# Patient Record
Sex: Female | Born: 1995 | Race: Black or African American | Hispanic: No | Marital: Single
Health system: Southern US, Community
[De-identification: ages and names within clinical notes are randomized; demographics above are authoritative.]

---

## 2017-03-20 DIAGNOSIS — S0993XA Unspecified injury of face, initial encounter: Secondary | ICD-10-CM | POA: Diagnosis present

## 2017-03-20 DIAGNOSIS — S161XXA Strain of muscle, fascia and tendon at neck level, initial encounter: Secondary | ICD-10-CM | POA: Insufficient documentation

## 2017-03-20 DIAGNOSIS — Y999 Unspecified external cause status: Secondary | ICD-10-CM | POA: Insufficient documentation

## 2017-03-20 DIAGNOSIS — Y9389 Activity, other specified: Secondary | ICD-10-CM | POA: Insufficient documentation

## 2017-03-20 DIAGNOSIS — Y92410 Unspecified street and highway as the place of occurrence of the external cause: Secondary | ICD-10-CM | POA: Insufficient documentation

## 2017-03-20 DIAGNOSIS — S01112A Laceration without foreign body of left eyelid and periocular area, initial encounter: Secondary | ICD-10-CM | POA: Diagnosis not present
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
## 2017-03-21 ENCOUNTER — Encounter (HOSPITAL_COMMUNITY): Payer: Self-pay

## 2017-03-21 ENCOUNTER — Emergency Department (HOSPITAL_COMMUNITY)
Admission: EM | Admit: 2017-03-21 | Discharge: 2017-03-21 | Disposition: A | Discharge status: Home / Self Care | Payer: No Typology Code available for payment source | Attending: Emergency Medicine | Admitting: Emergency Medicine

## 2017-03-21 ENCOUNTER — Emergency Department (HOSPITAL_COMMUNITY): Payer: No Typology Code available for payment source

## 2017-03-21 DIAGNOSIS — S161XXA Strain of muscle, fascia and tendon at neck level, initial encounter: Secondary | ICD-10-CM

## 2017-03-21 DIAGNOSIS — S0181XA Laceration without foreign body of other part of head, initial encounter: Secondary | ICD-10-CM

## 2017-03-21 DIAGNOSIS — S0083XA Contusion of other part of head, initial encounter: Secondary | ICD-10-CM

## 2017-03-21 MED ORDER — IBUPROFEN 800 MG PO TABS: 800.0000 mg | ORAL_TABLET | Freq: Three times a day (TID) | ORAL | 0 refills | Status: AC | PRN

## 2017-03-21 NOTE — ED Notes (Signed)
Pt returned from CT °

## 2017-03-21 NOTE — ED Notes (Signed)
Bed: WTR6 Expected date:  Expected time:  Means of arrival:  Comments: 

## 2017-03-21 NOTE — ED Notes (Signed)
dermabond ready at nurses station

## 2017-03-21 NOTE — Discharge Instructions (Signed)
Return here as needed.  Follow-up with a primary care doctor.  His ice and heat on the areas that are sore
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
